# Patient Record
Sex: Male | Born: 1980 | Race: White | Hispanic: No | Marital: Married
Health system: Midwestern US, Academic
[De-identification: ages and names within clinical notes are randomized; demographics above are authoritative.]

---

## 2017-06-25 ENCOUNTER — Ambulatory Visit: Admit: 2017-06-25 | Discharge: 2017-06-25

## 2017-06-25 ENCOUNTER — Encounter: Admit: 2017-06-25 | Discharge: 2017-06-25

## 2017-06-25 DIAGNOSIS — M549 Dorsalgia, unspecified: ICD-10-CM

## 2017-06-25 DIAGNOSIS — R197 Diarrhea, unspecified: Principal | ICD-10-CM

## 2017-06-25 DIAGNOSIS — K319 Disease of stomach and duodenum, unspecified: ICD-10-CM

## 2017-06-25 DIAGNOSIS — M1990 Unspecified osteoarthritis, unspecified site: Principal | ICD-10-CM

## 2017-06-25 DIAGNOSIS — R1013 Epigastric pain: ICD-10-CM

## 2017-06-25 DIAGNOSIS — H547 Unspecified visual loss: ICD-10-CM

## 2017-06-25 MED ORDER — SODIUM CHLORIDE 0.9 % IV SOLP
INTRAVENOUS | 0 refills | Status: CN
Start: 2017-06-25 — End: ?

## 2017-06-25 MED ORDER — HYOSCYAMINE SULFATE 0.125 MG PO TBDI
125 ug | ORAL_TABLET | SUBLINGUAL | 1 refills | Status: AC | PRN
Start: 2017-06-25 — End: 2017-07-28

## 2017-06-25 MED ORDER — LOPERAMIDE 2 MG PO CAP
2 mg | ORAL_CAPSULE | Freq: Four times a day (QID) | ORAL | 5 refills | 10.00000 days | Status: AC | PRN
Start: 2017-06-25 — End: 2017-07-28

## 2017-06-25 MED ORDER — PSYLLIUM HUSK (WITH SUGAR) 3.4 GRAM PO PWPK
1 | PACK | Freq: Two times a day (BID) | ORAL | 5 refills | Status: AC
Start: 2017-06-25 — End: 2017-07-28

## 2017-07-02 ENCOUNTER — Encounter: Admit: 2017-07-02 | Discharge: 2017-07-02

## 2017-07-08 ENCOUNTER — Encounter: Admit: 2017-07-08 | Discharge: 2017-07-08

## 2017-07-08 ENCOUNTER — Ambulatory Visit: Admit: 2017-07-08 | Discharge: 2017-07-08

## 2017-07-08 DIAGNOSIS — F17210 Nicotine dependence, cigarettes, uncomplicated: ICD-10-CM

## 2017-07-08 DIAGNOSIS — K861 Other chronic pancreatitis: ICD-10-CM

## 2017-07-08 DIAGNOSIS — R1013 Epigastric pain: Principal | ICD-10-CM

## 2017-07-08 DIAGNOSIS — M1990 Unspecified osteoarthritis, unspecified site: Principal | ICD-10-CM

## 2017-07-08 DIAGNOSIS — K319 Disease of stomach and duodenum, unspecified: ICD-10-CM

## 2017-07-08 DIAGNOSIS — R634 Abnormal weight loss: ICD-10-CM

## 2017-07-08 DIAGNOSIS — H547 Unspecified visual loss: ICD-10-CM

## 2017-07-08 DIAGNOSIS — M549 Dorsalgia, unspecified: ICD-10-CM

## 2017-07-08 LAB — THYROID STIMULATING HORMONE-TSH: Lab: 0.7 uU/mL (ref 0.35–5.00)

## 2017-07-08 MED ORDER — LIDOCAINE (PF) 200 MG/10 ML (2 %) IJ SYRG
0 refills | Status: DC
Start: 2017-07-08 — End: 2017-07-08
  Administered 2017-07-08: 14:00:00 60 mg via INTRAVENOUS

## 2017-07-08 MED ORDER — FENTANYL CITRATE (PF) 50 MCG/ML IJ SOLN
0 refills | Status: DC
Start: 2017-07-08 — End: 2017-07-08
  Administered 2017-07-08: 14:00:00 50 ug via INTRAVENOUS

## 2017-07-08 MED ORDER — LACTATED RINGERS IV SOLP
1000 mL | INTRAVENOUS | 0 refills | Status: DC
Start: 2017-07-08 — End: 2017-07-08
  Administered 2017-07-08: 13:00:00 1000 mL via INTRAVENOUS

## 2017-07-08 MED ORDER — LIPASE-PROTEASE-AMYLASE 20,880-78,300- 78,300 UNIT PO TAB
3 | ORAL_TABLET | Freq: Three times a day (TID) | ORAL | 5 refills | 30.00000 days | Status: AC
Start: 2017-07-08 — End: 2017-07-25

## 2017-07-08 MED ORDER — FENTANYL CITRATE (PF) 50 MCG/ML IJ SOLN
50 ug | INTRAVENOUS | 0 refills | Status: CN | PRN
Start: 2017-07-08 — End: ?

## 2017-07-08 MED ORDER — PROPOFOL 10 MG/ML IV EMUL 50 ML (INFUSION)(AM)(OR)
INTRAVENOUS | 0 refills | Status: DC
Start: 2017-07-08 — End: 2017-07-08
  Administered 2017-07-08: 14:00:00 140 ug/kg/min via INTRAVENOUS

## 2017-07-08 MED ORDER — ONDANSETRON HCL (PF) 4 MG/2 ML IJ SOLN
4 mg | Freq: Once | INTRAVENOUS | 0 refills | Status: CN | PRN
Start: 2017-07-08 — End: ?

## 2017-07-09 ENCOUNTER — Encounter: Admit: 2017-07-09 | Discharge: 2017-07-09

## 2017-07-09 MED ORDER — LIPASE-PROTEASE-AMYLASE (CREON 24,000) 24,000-76,000- 120,000 UNIT PO CPDR
3 | ORAL_CAPSULE | Freq: Three times a day (TID) | ORAL | 5 refills | 30.00000 days | Status: AC
Start: 2017-07-09 — End: 2017-07-28

## 2017-07-10 ENCOUNTER — Encounter: Admit: 2017-07-10 | Discharge: 2017-07-10

## 2017-07-10 DIAGNOSIS — M1990 Unspecified osteoarthritis, unspecified site: Principal | ICD-10-CM

## 2017-07-10 DIAGNOSIS — M549 Dorsalgia, unspecified: ICD-10-CM

## 2017-07-10 DIAGNOSIS — K319 Disease of stomach and duodenum, unspecified: ICD-10-CM

## 2017-07-10 DIAGNOSIS — H547 Unspecified visual loss: ICD-10-CM

## 2017-07-16 ENCOUNTER — Encounter: Admit: 2017-07-16 | Discharge: 2017-07-16

## 2017-07-16 DIAGNOSIS — R197 Diarrhea, unspecified: Principal | ICD-10-CM

## 2017-07-17 ENCOUNTER — Encounter: Admit: 2017-07-17 | Discharge: 2017-07-17

## 2017-07-17 DIAGNOSIS — M1990 Unspecified osteoarthritis, unspecified site: Principal | ICD-10-CM

## 2017-07-17 DIAGNOSIS — M549 Dorsalgia, unspecified: ICD-10-CM

## 2017-07-17 DIAGNOSIS — K319 Disease of stomach and duodenum, unspecified: ICD-10-CM

## 2017-07-17 DIAGNOSIS — H547 Unspecified visual loss: ICD-10-CM

## 2017-07-21 ENCOUNTER — Encounter: Admit: 2017-07-21 | Discharge: 2017-07-21

## 2017-07-22 ENCOUNTER — Encounter: Admit: 2017-07-22 | Discharge: 2017-07-22

## 2017-07-24 ENCOUNTER — Encounter: Admit: 2017-07-24 | Discharge: 2017-07-24

## 2017-07-28 ENCOUNTER — Encounter: Admit: 2017-07-28 | Discharge: 2017-07-28

## 2017-07-28 MED ORDER — LOPERAMIDE 2 MG PO CAP
2 mg | ORAL_CAPSULE | Freq: Four times a day (QID) | ORAL | 5 refills | 23.00000 days | Status: AC | PRN
Start: 2017-07-28 — End: 2018-07-27

## 2017-07-28 MED ORDER — PSYLLIUM HUSK (WITH SUGAR) 3.4 GRAM PO PWPK
1 | PACK | Freq: Two times a day (BID) | ORAL | 5 refills | Status: AC
Start: 2017-07-28 — End: 2017-07-28

## 2017-07-28 MED ORDER — PSYLLIUM HUSK (WITH SUGAR) 3.4 GRAM PO PWPK
1 | PACK | Freq: Two times a day (BID) | ORAL | 5 refills | Status: AC
Start: 2017-07-28 — End: 2018-07-27

## 2017-07-28 MED ORDER — LIPASE-PROTEASE-AMYLASE (CREON 24,000) 24,000-76,000- 120,000 UNIT PO CPDR
3 | ORAL_CAPSULE | Freq: Three times a day (TID) | ORAL | 5 refills | 30.00000 days | Status: AC
Start: 2017-07-28 — End: 2017-10-24

## 2017-07-28 MED ORDER — HYOSCYAMINE SULFATE 0.125 MG PO TBDI
125 ug | ORAL_TABLET | SUBLINGUAL | 1 refills | Status: AC | PRN
Start: 2017-07-28 — End: 2017-07-28

## 2017-07-28 MED ORDER — LIPASE-PROTEASE-AMYLASE (CREON 24,000) 24,000-76,000- 120,000 UNIT PO CPDR
3 | ORAL_CAPSULE | Freq: Three times a day (TID) | ORAL | 5 refills | 30.00000 days | Status: AC
Start: 2017-07-28 — End: 2017-07-28

## 2017-07-28 MED ORDER — LOPERAMIDE 2 MG PO CAP
2 mg | ORAL_CAPSULE | Freq: Four times a day (QID) | ORAL | 5 refills | 23.00000 days | Status: AC | PRN
Start: 2017-07-28 — End: 2017-07-28

## 2017-07-28 MED ORDER — HYOSCYAMINE SULFATE 0.125 MG PO TBDI
125 ug | ORAL_TABLET | SUBLINGUAL | 1 refills | Status: AC | PRN
Start: 2017-07-28 — End: 2019-02-23

## 2017-07-30 ENCOUNTER — Encounter: Admit: 2017-07-30 | Discharge: 2017-07-30

## 2017-08-08 ENCOUNTER — Encounter: Admit: 2017-08-08 | Discharge: 2017-08-08

## 2017-08-08 DIAGNOSIS — M549 Dorsalgia, unspecified: ICD-10-CM

## 2017-08-08 DIAGNOSIS — R002 Palpitations: Principal | ICD-10-CM

## 2017-08-08 DIAGNOSIS — K319 Disease of stomach and duodenum, unspecified: ICD-10-CM

## 2017-08-08 DIAGNOSIS — K861 Other chronic pancreatitis: ICD-10-CM

## 2017-08-08 DIAGNOSIS — M1990 Unspecified osteoarthritis, unspecified site: Principal | ICD-10-CM

## 2017-08-08 DIAGNOSIS — H547 Unspecified visual loss: ICD-10-CM

## 2017-08-12 ENCOUNTER — Encounter: Admit: 2017-08-12 | Discharge: 2017-08-12

## 2017-08-12 ENCOUNTER — Ambulatory Visit: Admit: 2017-08-12 | Discharge: 2017-08-13 | Payer: Medicaid Other

## 2017-08-12 DIAGNOSIS — R002 Palpitations: Principal | ICD-10-CM

## 2017-08-12 DIAGNOSIS — H547 Unspecified visual loss: ICD-10-CM

## 2017-08-12 DIAGNOSIS — K319 Disease of stomach and duodenum, unspecified: ICD-10-CM

## 2017-08-12 DIAGNOSIS — M1990 Unspecified osteoarthritis, unspecified site: Principal | ICD-10-CM

## 2017-08-12 DIAGNOSIS — M549 Dorsalgia, unspecified: ICD-10-CM

## 2017-08-12 DIAGNOSIS — K861 Other chronic pancreatitis: ICD-10-CM

## 2017-08-25 ENCOUNTER — Ambulatory Visit: Admit: 2017-08-25 | Discharge: 2017-08-26 | Payer: Medicaid Other

## 2017-08-25 ENCOUNTER — Encounter: Admit: 2017-08-25 | Discharge: 2017-08-25

## 2017-08-25 DIAGNOSIS — K861 Other chronic pancreatitis: ICD-10-CM

## 2017-08-25 DIAGNOSIS — R002 Palpitations: Principal | ICD-10-CM

## 2017-08-25 MED ORDER — PERFLUTREN LIPID MICROSPHERES 1.1 MG/ML IV SUSP
1-20 mL | Freq: Once | INTRAVENOUS | 0 refills | Status: CP | PRN
Start: 2017-08-25 — End: ?

## 2017-08-28 ENCOUNTER — Encounter: Admit: 2017-08-28 | Discharge: 2017-08-28

## 2017-09-03 ENCOUNTER — Encounter: Admit: 2017-09-03 | Discharge: 2017-09-03

## 2017-09-03 DIAGNOSIS — R197 Diarrhea, unspecified: Principal | ICD-10-CM

## 2017-09-09 ENCOUNTER — Encounter: Admit: 2017-09-09 | Discharge: 2017-09-09

## 2017-09-18 ENCOUNTER — Encounter: Admit: 2017-09-18 | Discharge: 2017-09-18

## 2017-10-24 ENCOUNTER — Encounter: Admit: 2017-10-24 | Discharge: 2017-10-24

## 2017-10-24 ENCOUNTER — Ambulatory Visit: Admit: 2017-10-24 | Discharge: 2017-10-25 | Payer: Medicaid Other

## 2017-10-24 DIAGNOSIS — K319 Disease of stomach and duodenum, unspecified: ICD-10-CM

## 2017-10-24 DIAGNOSIS — R197 Diarrhea, unspecified: Secondary | ICD-10-CM

## 2017-10-24 DIAGNOSIS — H547 Unspecified visual loss: ICD-10-CM

## 2017-10-24 DIAGNOSIS — M549 Dorsalgia, unspecified: ICD-10-CM

## 2017-10-24 DIAGNOSIS — M1990 Unspecified osteoarthritis, unspecified site: Principal | ICD-10-CM

## 2017-10-24 MED ORDER — PREGABALIN 75 MG PO CAP
75 mg | ORAL_CAPSULE | Freq: Two times a day (BID) | ORAL | 3 refills | Status: AC
Start: 2017-10-24 — End: 2017-11-06

## 2017-10-24 MED ORDER — ONDANSETRON HCL 8 MG PO TAB
8 mg | ORAL_TABLET | ORAL | 2 refills | 8.00000 days | Status: AC | PRN
Start: 2017-10-24 — End: 2018-04-01

## 2017-10-24 MED ORDER — LIPASE-PROTEASE-AMYLASE(+)(VIOKACE) 20,880-78,300- 78,300 UNIT PO TAB
1 | ORAL_TABLET | Freq: Three times a day (TID) | ORAL | 3 refills | 30.00000 days | Status: AC
Start: 2017-10-24 — End: 2017-11-06

## 2017-10-24 MED ORDER — OMEPRAZOLE 20 MG PO CPDR
20 mg | ORAL_CAPSULE | Freq: Every day | ORAL | 3 refills | Status: AC
Start: 2017-10-24 — End: 2018-04-01

## 2017-10-25 DIAGNOSIS — D125 Benign neoplasm of sigmoid colon: ICD-10-CM

## 2017-10-25 DIAGNOSIS — K861 Other chronic pancreatitis: Principal | ICD-10-CM

## 2017-10-25 DIAGNOSIS — K219 Gastro-esophageal reflux disease without esophagitis: ICD-10-CM

## 2017-10-25 DIAGNOSIS — R1010 Upper abdominal pain, unspecified: ICD-10-CM

## 2017-10-26 ENCOUNTER — Encounter: Admit: 2017-10-26 | Discharge: 2017-10-26

## 2017-10-26 DIAGNOSIS — M1990 Unspecified osteoarthritis, unspecified site: Principal | ICD-10-CM

## 2017-10-26 DIAGNOSIS — M549 Dorsalgia, unspecified: ICD-10-CM

## 2017-10-26 DIAGNOSIS — K319 Disease of stomach and duodenum, unspecified: ICD-10-CM

## 2017-10-26 DIAGNOSIS — H547 Unspecified visual loss: ICD-10-CM

## 2017-10-26 DIAGNOSIS — K861 Other chronic pancreatitis: ICD-10-CM

## 2017-10-28 ENCOUNTER — Encounter: Admit: 2017-10-28 | Discharge: 2017-10-28

## 2017-10-29 ENCOUNTER — Encounter: Admit: 2017-10-29 | Discharge: 2017-10-29

## 2017-11-06 MED ORDER — NORTRIPTYLINE 25 MG PO CAP
25 mg | ORAL_CAPSULE | Freq: Every evening | ORAL | 2 refills | Status: AC
Start: 2017-11-06 — End: 2018-07-27

## 2017-11-06 MED ORDER — LIPASE-PROTEASE-AMYLASE 25,000-79,000- 105,000 UNIT PO CPDR
3 | ORAL_CAPSULE | Freq: Three times a day (TID) | ORAL | 1 refills | 30.00000 days | Status: AC
Start: 2017-11-06 — End: 2018-01-20

## 2017-12-03 ENCOUNTER — Encounter: Admit: 2017-12-03 | Discharge: 2017-12-03

## 2017-12-31 ENCOUNTER — Encounter: Admit: 2017-12-31 | Discharge: 2017-12-31

## 2017-12-31 ENCOUNTER — Ambulatory Visit: Admit: 2017-12-31 | Discharge: 2018-01-01 | Payer: Medicaid Other

## 2017-12-31 DIAGNOSIS — M549 Dorsalgia, unspecified: ICD-10-CM

## 2017-12-31 DIAGNOSIS — K219 Gastro-esophageal reflux disease without esophagitis: ICD-10-CM

## 2017-12-31 DIAGNOSIS — H547 Unspecified visual loss: ICD-10-CM

## 2017-12-31 DIAGNOSIS — R197 Diarrhea, unspecified: ICD-10-CM

## 2017-12-31 DIAGNOSIS — R1010 Upper abdominal pain, unspecified: Principal | ICD-10-CM

## 2017-12-31 DIAGNOSIS — K861 Other chronic pancreatitis: ICD-10-CM

## 2017-12-31 DIAGNOSIS — K319 Disease of stomach and duodenum, unspecified: ICD-10-CM

## 2017-12-31 DIAGNOSIS — M1990 Unspecified osteoarthritis, unspecified site: Principal | ICD-10-CM

## 2017-12-31 DIAGNOSIS — D125 Benign neoplasm of sigmoid colon: ICD-10-CM

## 2018-01-06 ENCOUNTER — Encounter: Admit: 2018-01-06 | Discharge: 2018-01-06

## 2018-01-06 DIAGNOSIS — F419 Anxiety disorder, unspecified: Principal | ICD-10-CM

## 2018-01-16 ENCOUNTER — Encounter: Admit: 2018-01-16 | Discharge: 2018-01-16

## 2018-01-19 ENCOUNTER — Encounter: Admit: 2018-01-19 | Discharge: 2018-01-19

## 2018-01-20 MED ORDER — LIPASE-PROTEASE-AMYLASE 25,000-79,000- 105,000 UNIT PO CPDR
3 | ORAL_CAPSULE | Freq: Three times a day (TID) | ORAL | 5 refills | 30.00000 days | Status: AC
Start: 2018-01-20 — End: 2018-04-01

## 2018-01-20 MED ORDER — ZENPEP 25,000-79,000- 105,000 UNIT PO CPDR
ORAL_CAPSULE | Freq: Three times a day (TID) | 1 refills
Start: 2018-01-20 — End: ?

## 2018-03-30 ENCOUNTER — Encounter: Admit: 2018-03-30 | Discharge: 2018-03-30

## 2018-03-30 DIAGNOSIS — K861 Other chronic pancreatitis: Principal | ICD-10-CM

## 2018-04-01 MED ORDER — ONDANSETRON HCL 8 MG PO TAB
8 mg | ORAL_TABLET | ORAL | 2 refills | 8.00000 days | Status: AC | PRN
Start: 2018-04-01 — End: 2018-07-13

## 2018-04-01 MED ORDER — LIPASE-PROTEASE-AMYLASE (VIOKACE) 20,880-78,300- 78,300 UNIT PO TAB
1 | ORAL_TABLET | Freq: Three times a day (TID) | ORAL | 3 refills | 30.00000 days | Status: AC
Start: 2018-04-01 — End: 2018-07-27

## 2018-04-01 MED ORDER — OMEPRAZOLE 20 MG PO CPDR
20 mg | ORAL_CAPSULE | Freq: Every day | ORAL | 3 refills | Status: AC
Start: 2018-04-01 — End: 2019-02-23

## 2018-04-02 ENCOUNTER — Encounter: Admit: 2018-04-02 | Discharge: 2018-04-02

## 2018-04-03 ENCOUNTER — Encounter: Admit: 2018-04-03 | Discharge: 2018-04-03

## 2018-04-03 ENCOUNTER — Encounter: Admit: 2018-04-03 | Discharge: 2018-04-04

## 2018-04-03 DIAGNOSIS — R69 Illness, unspecified: Principal | ICD-10-CM

## 2018-04-14 ENCOUNTER — Encounter: Admit: 2018-04-14 | Discharge: 2018-04-15

## 2018-05-25 ENCOUNTER — Encounter: Admit: 2018-05-25 | Discharge: 2018-05-25

## 2018-05-25 DIAGNOSIS — R69 Illness, unspecified: Principal | ICD-10-CM

## 2018-06-01 ENCOUNTER — Encounter: Admit: 2018-06-01 | Discharge: 2018-06-01

## 2018-07-13 ENCOUNTER — Encounter: Admit: 2018-07-13 | Discharge: 2018-07-13

## 2018-07-13 MED ORDER — ONDANSETRON 8 MG PO TBDI
ORAL_TABLET | ORAL | 1 refills | 8.00000 days | Status: AC | PRN
Start: 2018-07-13 — End: 2018-09-14

## 2018-07-16 ENCOUNTER — Encounter: Admit: 2018-07-16 | Discharge: 2018-07-16

## 2018-07-22 ENCOUNTER — Encounter: Admit: 2018-07-22 | Discharge: 2018-07-22

## 2018-07-22 NOTE — Patient Instructions
It was a pleasure seeing you in clinic today.    Jen Mow RN, CNC/Cassandra Seib RN   Dr. Ivin Booty T. Bunch/Ortho Spine Surgery  [facility]  [facility]  [address]  [place], [place] [address]  Phone: [phone]   Fax [phone]  Scheduling [phone]  [email or web address]      For up to date information on the COVID-19 virus, visit the [facility] website. [email or web address]  ? General supportive care during cold and flu season and infection prevention reminders:   o Wash hands often with soap and water for at least 20 seconds  o Cover your mouth and nose  o Social distancing: try to maintain 6 feet between you and other people  o Stay home if sick and symptoms mild or manageable  ? If you must be around people wear a mask    ? If you are having symptoms of a lower respiratory infection (cough, shortness of breath) and/or fever AND either traveled in last 30 days (internationally or to region of exposure) OR known exposure to patient with COVID19:    o Call your primary care provider for questions or health needs.   ? Tell your doctor about your recent travel and your symptoms    o In a medical emergency, call 911 or go to the nearest emergency room.

## 2018-07-23 ENCOUNTER — Encounter: Admit: 2018-07-23 | Discharge: 2018-07-23

## 2018-07-24 ENCOUNTER — Encounter: Admit: 2018-07-24 | Discharge: 2018-07-24

## 2018-07-24 DIAGNOSIS — M549 Dorsalgia, unspecified: ICD-10-CM

## 2018-07-24 DIAGNOSIS — M5033 Other cervical disc degeneration, cervicothoracic region: Secondary | ICD-10-CM

## 2018-07-24 DIAGNOSIS — M1990 Unspecified osteoarthritis, unspecified site: Principal | ICD-10-CM

## 2018-07-24 DIAGNOSIS — H547 Unspecified visual loss: ICD-10-CM

## 2018-07-24 DIAGNOSIS — K319 Disease of stomach and duodenum, unspecified: ICD-10-CM

## 2018-07-24 DIAGNOSIS — K861 Other chronic pancreatitis: ICD-10-CM

## 2018-07-24 NOTE — Progress Notes
think that doing an ACDF at [facility] is going to cure his pain for sure.  We will follow up with the patient after the MRI is completed.          In the presence of Aaron Rosales, MD , I have taken down these notes, Aaron Olson, Scribe. July 24, 2018 1:24 PM

## 2018-07-25 ENCOUNTER — Ambulatory Visit: Admit: 2018-07-24 | Discharge: 2018-07-25 | Payer: Medicaid Other

## 2018-07-25 DIAGNOSIS — M4802 Spinal stenosis, cervical region: ICD-10-CM

## 2018-07-25 DIAGNOSIS — M5416 Radiculopathy, lumbar region: Principal | ICD-10-CM

## 2018-07-25 DIAGNOSIS — M5030 Other cervical disc degeneration, unspecified cervical region: ICD-10-CM

## 2018-07-25 DIAGNOSIS — G9589 Other specified diseases of spinal cord: ICD-10-CM

## 2018-07-25 DIAGNOSIS — M4626 Osteomyelitis of vertebra, lumbar region: Secondary | ICD-10-CM

## 2018-07-27 ENCOUNTER — Encounter: Admit: 2018-07-27 | Discharge: 2018-07-27

## 2018-07-27 ENCOUNTER — Ambulatory Visit: Admit: 2018-07-27 | Discharge: 2018-07-28 | Payer: Medicaid Other

## 2018-07-27 DIAGNOSIS — K861 Other chronic pancreatitis: ICD-10-CM

## 2018-07-27 DIAGNOSIS — M549 Dorsalgia, unspecified: ICD-10-CM

## 2018-07-27 DIAGNOSIS — M1990 Unspecified osteoarthritis, unspecified site: Principal | ICD-10-CM

## 2018-07-27 DIAGNOSIS — Z86010 Personal history of colonic polyps: ICD-10-CM

## 2018-07-27 DIAGNOSIS — K319 Disease of stomach and duodenum, unspecified: ICD-10-CM

## 2018-07-27 DIAGNOSIS — K5900 Constipation, unspecified: Principal | ICD-10-CM

## 2018-07-27 DIAGNOSIS — R198 Other specified symptoms and signs involving the digestive system and abdomen: ICD-10-CM

## 2018-07-27 DIAGNOSIS — H547 Unspecified visual loss: ICD-10-CM

## 2018-07-27 MED ORDER — RIFAXIMIN 550 MG PO TAB
550 mg | ORAL_TABLET | Freq: Three times a day (TID) | ORAL | 0 refills | 30.00000 days | Status: AC
Start: 2018-07-27 — End: ?

## 2018-07-27 MED ORDER — LIPASE-PROTEASE-AMYLASE (VIOKACE) 20,880-78,300- 78,300 UNIT PO TAB
1 | ORAL_TABLET | Freq: Three times a day (TID) | ORAL | 3 refills | 30.00000 days | Status: DC
Start: 2018-07-27 — End: 2019-03-19

## 2018-07-27 MED ORDER — PREGABALIN 75 MG PO CAP
75 mg | ORAL_CAPSULE | Freq: Two times a day (BID) | ORAL | 5 refills | Status: DC
Start: 2018-07-27 — End: 2019-02-24

## 2018-07-27 MED ORDER — SODIUM CHLORIDE 0.9 % IV SOLP
INTRAVENOUS | 0 refills | Status: CN
Start: 2018-07-27 — End: ?

## 2018-07-28 ENCOUNTER — Encounter: Admit: 2018-07-28 | Discharge: 2018-07-28

## 2018-07-28 DIAGNOSIS — K861 Other chronic pancreatitis: Principal | ICD-10-CM

## 2018-07-28 NOTE — Telephone Encounter
Incoming fax pt needs a PA for Xifaxan.

## 2018-07-29 ENCOUNTER — Ambulatory Visit: Admit: 2018-07-29 | Discharge: 2018-07-29 | Payer: Medicaid Other

## 2018-07-29 DIAGNOSIS — R198 Other specified symptoms and signs involving the digestive system and abdomen: ICD-10-CM

## 2018-08-11 ENCOUNTER — Encounter: Admit: 2018-08-11 | Discharge: 2018-08-11

## 2018-08-12 ENCOUNTER — Encounter: Admit: 2018-08-12 | Discharge: 2018-08-12

## 2018-08-12 MED ORDER — ZENPEP 25,000-79,000- 105,000 UNIT PO CPDR
ORAL_CAPSULE | Freq: Three times a day (TID) | ORAL | 4 refills | 30.00000 days | Status: DC
Start: 2018-08-12 — End: 2019-02-24

## 2018-08-12 NOTE — Telephone Encounter
Refill request received for ZenPep  Last OV 07/27/2018  Last fill  01/30/2018 390 w/5 refills    Routing to [employer], APRN u for approval/refusal      PA in process for Viokase

## 2018-08-24 ENCOUNTER — Encounter: Admit: 2018-08-24 | Discharge: 2018-08-24

## 2018-09-14 ENCOUNTER — Encounter: Admit: 2018-09-14 | Discharge: 2018-09-14

## 2018-09-14 MED ORDER — ONDANSETRON 8 MG PO TBDI
ORAL_TABLET | ORAL | 1 refills | 8.00000 days | Status: DC | PRN
Start: 2018-09-14 — End: 2018-11-20

## 2018-09-14 NOTE — Telephone Encounter
Refill request received for zofran  Last OV 07/27/2018  Last fill 07/13/2018 60tabs w/1 refill    Routing to Glenice Bow, APRN for approval/refusal

## 2018-10-11 IMAGING — CR PELVIS
5 series · 5 of 5 positions shown · non-contrast
Comparison: none

[l-spine ap]
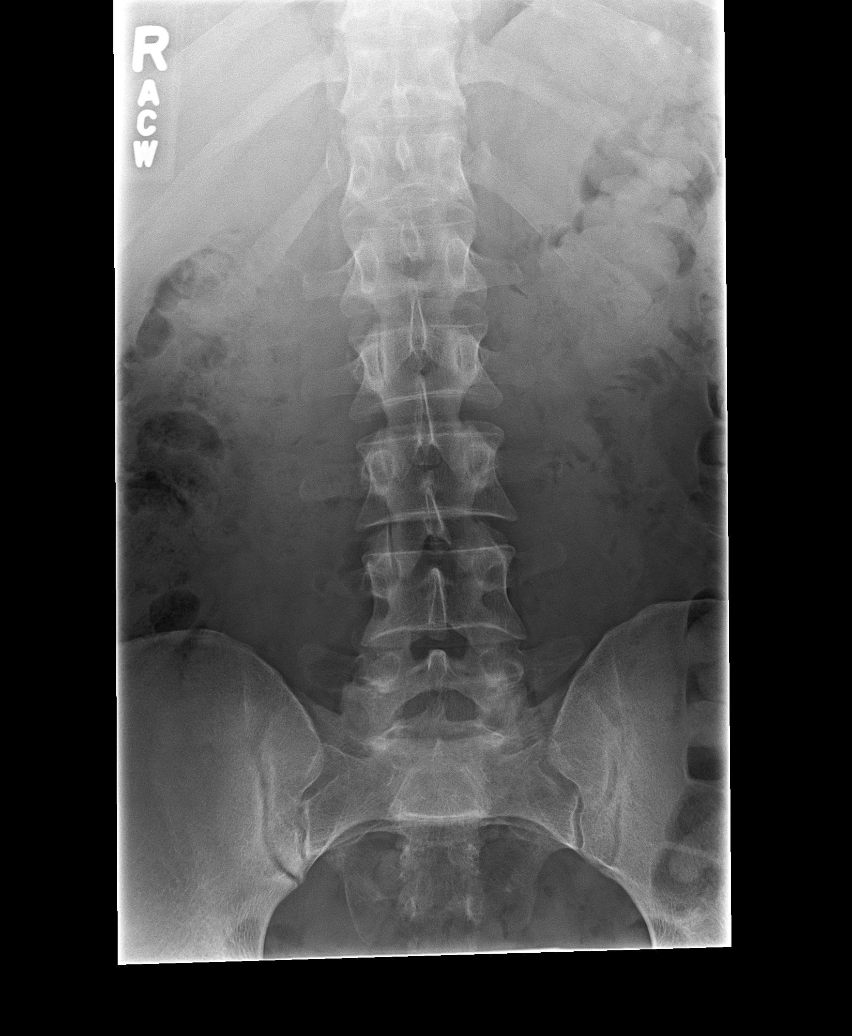

[l-spine obl (1 of 2)]
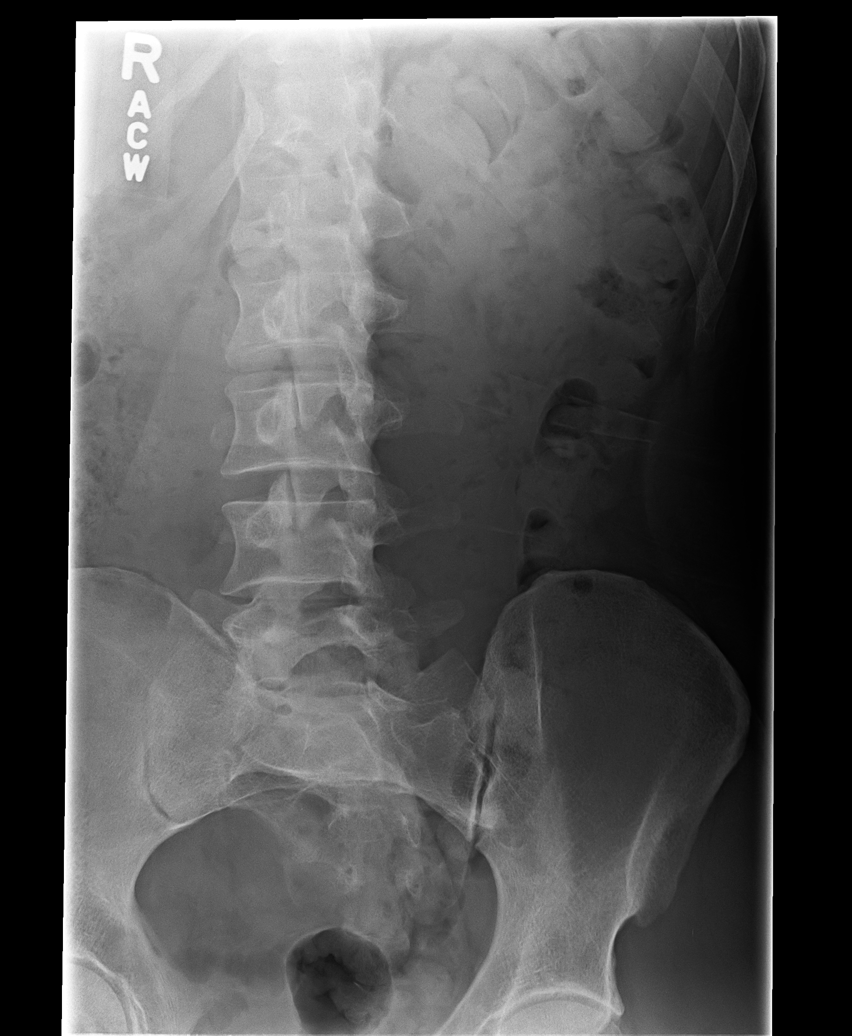

[l-spine obl (2 of 2)]
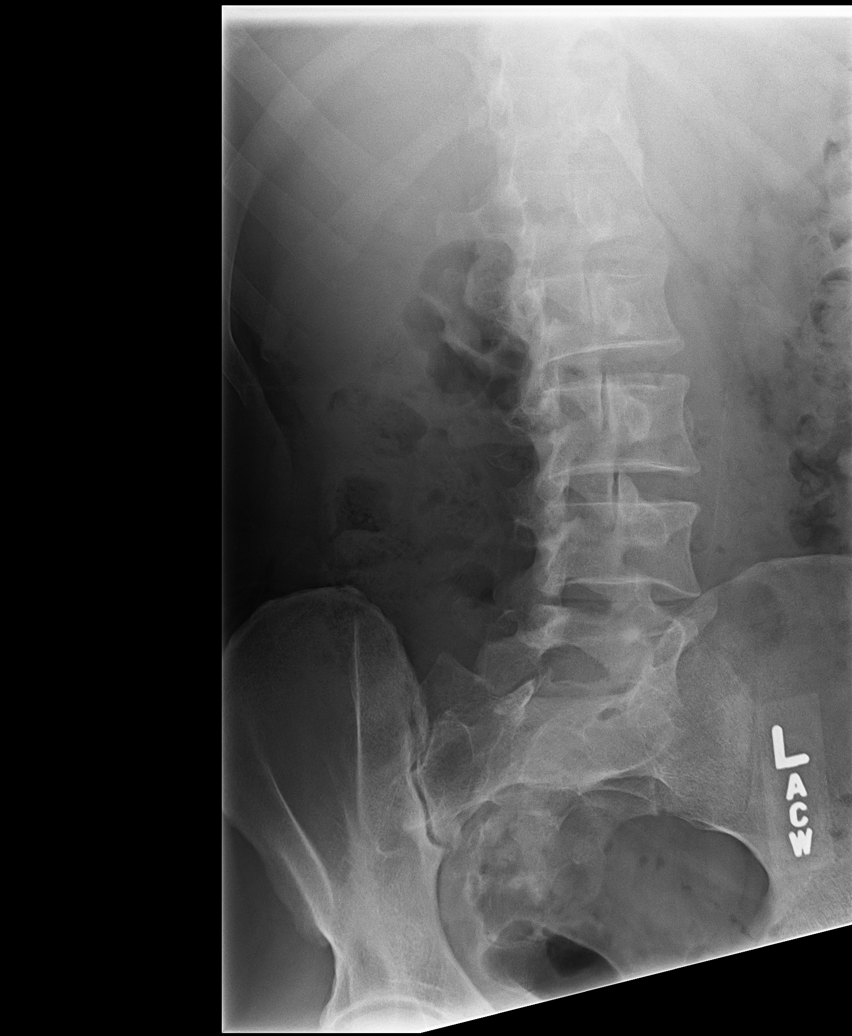

[l-spine lat]
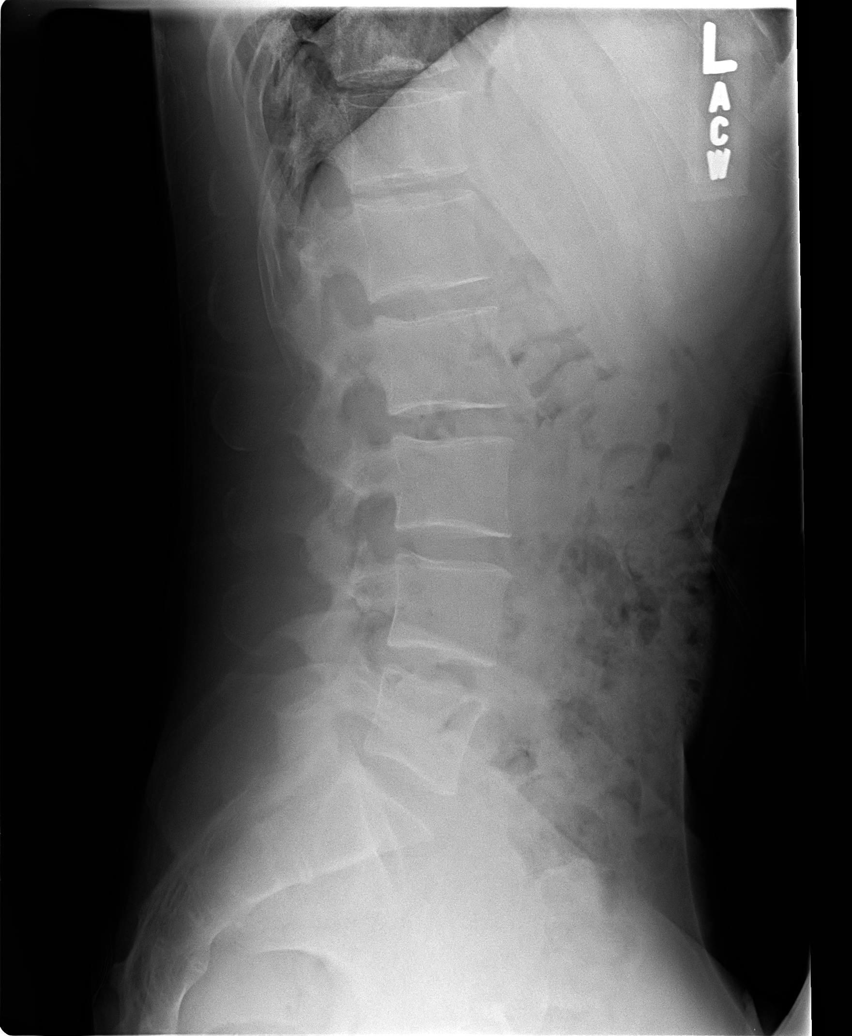

[l-spine l5-s1]
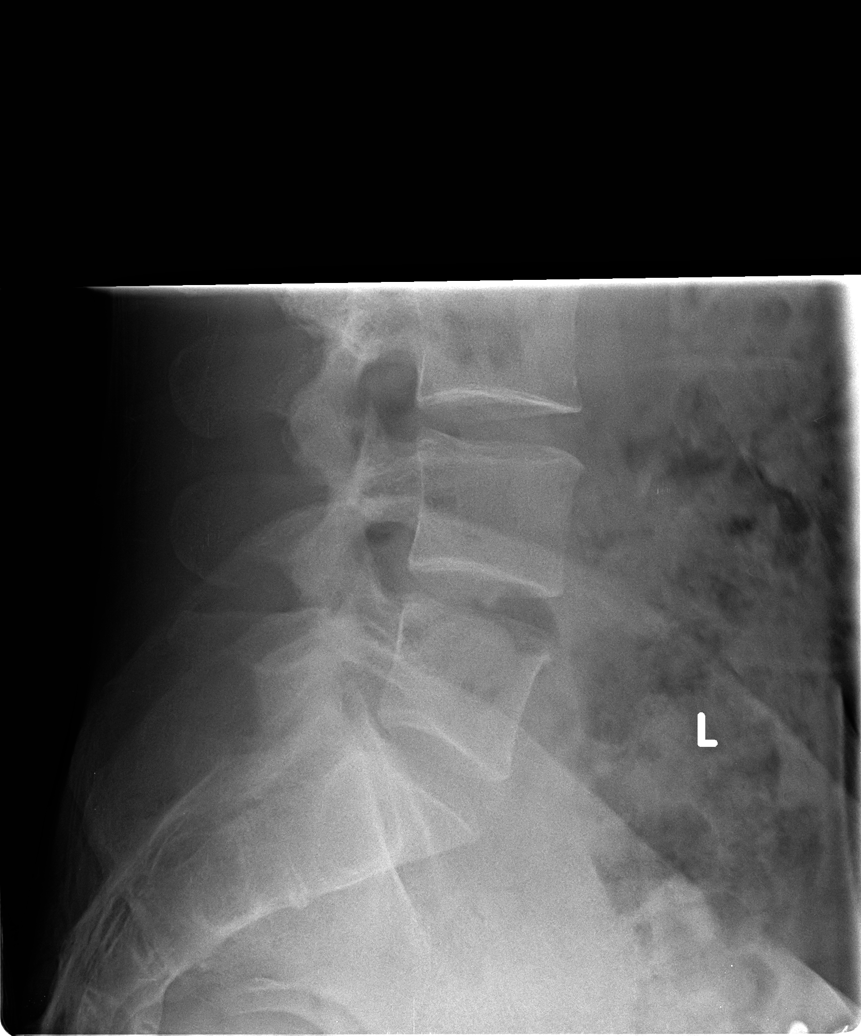

[5 of 5 positions shown; findings below may reference images not displayed]

EXAM
RADIOLOGIC EXAMINATION, SPINE, LUMBOSACRAL; MINIMUM 4 VIEWS, CPT 47884

INDICATION
chronic mid to low back pain.
Cronic mid to lower back pain. NKI AW

TECHNIQUE
Five views of the lumbar spine were obtained including obliques.

COMPARISONS
None

FINDINGS
There is no evidence of fracture. No alignment abnormality is identified. No soft tissue
abnormality is identified.

IMPRESSION
Normal lumbar spine series.

Tech Notes:

Cronic mid to lower back pain. NKI AW

## 2018-10-26 ENCOUNTER — Encounter: Admit: 2018-10-26 | Discharge: 2018-10-26

## 2018-10-26 NOTE — Telephone Encounter
Received message from scheduling team to make sure pt has prep instructions for upcoming procedure and if pt has COVID19 test scheduled. Pt does not need COVID19 test due to pt will not be under anastasia. Prep instructions were sent.

## 2018-11-20 ENCOUNTER — Encounter: Admit: 2018-11-20 | Discharge: 2018-11-20

## 2018-11-20 MED ORDER — ONDANSETRON 8 MG PO TBDI
ORAL_TABLET | ORAL | 1 refills | 8.00000 days | Status: AC | PRN
Start: 2018-11-20 — End: ?

## 2018-11-20 NOTE — Telephone Encounter
Refill request received for ondansetron  Last OV 07/27/2018  Last fill 09/14/2018 60 tabs w/1 refill    Routing to Glenice Bow, APRN for approval/refusal

## 2018-11-30 ENCOUNTER — Encounter: Admit: 2018-11-30 | Discharge: 2018-11-30

## 2018-11-30 DIAGNOSIS — K861 Other chronic pancreatitis: Secondary | ICD-10-CM

## 2018-11-30 DIAGNOSIS — H547 Unspecified visual loss: Secondary | ICD-10-CM

## 2018-11-30 DIAGNOSIS — M549 Dorsalgia, unspecified: Secondary | ICD-10-CM

## 2018-11-30 DIAGNOSIS — M1990 Unspecified osteoarthritis, unspecified site: Secondary | ICD-10-CM

## 2018-11-30 DIAGNOSIS — K319 Disease of stomach and duodenum, unspecified: Secondary | ICD-10-CM

## 2019-01-19 ENCOUNTER — Encounter: Admit: 2019-01-19 | Discharge: 2019-01-19

## 2019-01-19 NOTE — Telephone Encounter
-   Schedule ARM as previously recommended.   - KUB, radiology to comment on stool burden.   Dx abdominal pain.    - did he take Xifaxan previously as recommended? If so, what symptoms did it help.    - see me in clinic as planned.   - go to ER as needed in the meantime

## 2019-01-19 NOTE — Telephone Encounter
Pt reports that Lyrica has not worked. Pt has been trying it for a while without success. Pt is having anal pain and abdominal pain as well. Describes the pain as throbbing.  Feels the bowel movement moving around in colon and feel food digesting. Pt reports 1 small BM daily, if that, Sometimes pebbles and sometimes runny, feels like defecation is incomplete. Feels constipated and nauseous. Denies vomiting, Denies fever, has hot flashes and chills. Pt repots a spot on his belly button that is pulsating and hurts. Sharp pain that radiates down. Rectal area is a burning pain.     Pt is taking Viokase. 1-2 tablets with meals and 1 with snacks. Pt takes Zofran  when he is nauseous but not all the time bc he gets constipation and headaches with it.  Pt reports that the hyoscyamine has not helped with stomach pain. Pt reports that Prilosec is helping with GERD symptoms and gas.    Pt is on schedule to be seen on 10/22 at 8am full hour visit as this seems to be a complex patient. Routing to [place] for recommendations in the mean time.     Pt's spouse will attempt to reschedule his ARM. Scheduling number sent via mychart.

## 2019-02-03 ENCOUNTER — Encounter: Admit: 2019-02-03 | Discharge: 2019-02-03

## 2019-02-03 MED ORDER — TIZANIDINE 4 MG PO TAB
ORAL_TABLET | Freq: Three times a day (TID) | 0 refills
Start: 2019-02-03 — End: ?

## 2019-02-03 NOTE — Telephone Encounter
Refill request received for tizanidine  Last OV 07/27/2018      Spoke with pharmacy, sent in error. Was supposed to go primary physician. Routing to [employer] APRN for review for interactions with our prescribed medications as this was not on his list and refusal of medication.

## 2019-02-23 ENCOUNTER — Ambulatory Visit: Admit: 2019-02-23 | Discharge: 2019-02-24 | Payer: Medicaid Other

## 2019-02-23 ENCOUNTER — Encounter: Admit: 2019-02-23 | Discharge: 2019-02-23

## 2019-02-23 DIAGNOSIS — R109 Unspecified abdominal pain: Secondary | ICD-10-CM

## 2019-02-23 DIAGNOSIS — K319 Disease of stomach and duodenum, unspecified: Secondary | ICD-10-CM

## 2019-02-23 DIAGNOSIS — M549 Dorsalgia, unspecified: Secondary | ICD-10-CM

## 2019-02-23 DIAGNOSIS — K861 Other chronic pancreatitis: Secondary | ICD-10-CM

## 2019-02-23 DIAGNOSIS — M1990 Unspecified osteoarthritis, unspecified site: Secondary | ICD-10-CM

## 2019-02-23 DIAGNOSIS — H547 Unspecified visual loss: Secondary | ICD-10-CM

## 2019-02-23 MED ORDER — PANTOPRAZOLE 40 MG PO TBEC
40 mg | ORAL_TABLET | Freq: Every day | ORAL | 5 refills | 90.00000 days | Status: AC
Start: 2019-02-23 — End: ?

## 2019-02-23 MED ORDER — TRULANCE 3 MG PO TAB
3 mg | ORAL_TABLET | Freq: Every day | ORAL | 5 refills | Status: AC
Start: 2019-02-23 — End: ?

## 2019-02-23 MED ORDER — DICYCLOMINE 10 MG PO CAP
10 mg | ORAL_CAPSULE | Freq: Three times a day (TID) | ORAL | 5 refills | Status: AC | PRN
Start: 2019-02-23 — End: ?

## 2019-02-25 ENCOUNTER — Encounter: Admit: 2019-02-25 | Discharge: 2019-02-25

## 2019-02-25 NOTE — Telephone Encounter
Incoming fax PA needed for Trulance.     PA was Started through [email or web address]  Key Code: [id number]. Can take 24-72 hours for determination. If you have any question contact the number on the back of insurance card.

## 2019-03-10 ENCOUNTER — Encounter: Admit: 2019-03-10 | Discharge: 2019-03-10

## 2019-03-17 ENCOUNTER — Encounter: Admit: 2019-03-17 | Discharge: 2019-03-17

## 2019-03-17 NOTE — Telephone Encounter
Incoming call from pt SO, [name]. Pt has been rescheduled for a telehealth appt on 12/15 at 0800. She reports that the pt had one day of diarrhea since starting the Trulance but has been constipated since. He is still taking the Trulance daily. They will try to get the abdominal xray completed as soon as they can get into [facility].    Inquiring if ARM testing can be done at [facility] as well. Explained that we prefer those procedures are done at [facility], but will ask Lora NP if it would be ok to do the anorectal manometry elsewhere.

## 2019-03-17 NOTE — Telephone Encounter
I am not aware that [facility] dose ARM type studies. Natale Milch can check but I would prefer it done at [facility].

## 2019-03-19 ENCOUNTER — Encounter: Admit: 2019-03-19 | Discharge: 2019-03-19

## 2019-03-19 DIAGNOSIS — K861 Other chronic pancreatitis: Secondary | ICD-10-CM

## 2019-03-19 MED ORDER — VIOKACE 20,880-78,300- 78,300 UNIT PO TAB
ORAL_TABLET | Freq: Three times a day (TID) | ORAL | 2 refills | 30.00000 days | Status: AC
Start: 2019-03-19 — End: ?

## 2019-03-19 NOTE — Telephone Encounter
Refill request received for viokase  Last OV 02/23/2019  Last fill 07/27/2018 270 tabs w/3 refills    Routing to Glenice Bow, APRN for approval/refusal

## 2019-03-19 NOTE — Telephone Encounter
Reviewed KUB dated 03/18/2019 from [facility].  No evidence of dilated small bowel loops or air-fluid levels.  No pneumoperitoneum.  No high-grade obstruction noted. Diffuse colonic gas.  Moderate distal colonic stool and rectal stool.    Aaron Olson - call pt. Based on KUB findings, recommend the following:    1. Fleets enema X 1-2 days followed by dulcolax suppository daily as needed, use after breakfast and coffee  2. Trulance trial as planned.   3. Call with progress report in 2 weeks.   4. ARM as recommended.

## 2019-05-11 ENCOUNTER — Encounter: Admit: 2019-05-11 | Discharge: 2019-05-11

## 2019-05-11 NOTE — Telephone Encounter
Attempted to call patient to schedule appt per request of Dr. Milas Gain with Dr. Candi Leash. No answer. VM Left

## 2019-05-12 ENCOUNTER — Encounter: Admit: 2019-05-12 | Discharge: 2019-05-12

## 2019-06-07 ENCOUNTER — Encounter: Admit: 2019-06-07 | Discharge: 2019-06-07

## 2019-08-19 ENCOUNTER — Encounter: Admit: 2019-08-19 | Discharge: 2019-08-19

## 2019-08-19 ENCOUNTER — Encounter: Admit: 2018-12-01 | Discharge: 2018-12-01

## 2019-08-19 ENCOUNTER — Ambulatory Visit: Admit: 2019-08-19 | Discharge: 2019-08-19

## 2019-08-19 ENCOUNTER — Ambulatory Visit: Admit: 2019-08-19 | Discharge: 2019-08-20 | Payer: Medicaid Other

## 2019-08-19 MED ORDER — PEG-ELECTROLYTE SOLN 420 GRAM PO SOLR
0 refills | Status: AC
Start: 2019-08-19 — End: ?

## 2019-08-19 MED ORDER — HYOSCYAMINE SULFATE 0.125 MG PO TBDI
125 ug | ORAL_TABLET | SUBLINGUAL | 1 refills | Status: AC | PRN
Start: 2019-08-19 — End: ?

## 2019-08-20 ENCOUNTER — Encounter: Admit: 2019-08-20 | Discharge: 2019-08-20

## 2019-08-20 NOTE — Telephone Encounter
Pharmacy called. Pt needing a PA on Viokase. 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

## 2019-08-31 ENCOUNTER — Encounter: Admit: 2019-08-31 | Discharge: 2019-08-31

## 2019-08-31 NOTE — Telephone Encounter
Yes I would prefer the procedures be done at [facility] as I would like to ensure the correct biopsies are obtained. At our telemedicine visit he was agreeable to that.

## 2019-08-31 NOTE — Telephone Encounter
Received incoming VM from Kim with [facility] reporting that they received a referral for patient to have an EGD and Colonoscopy with them, but had some questions.   Outbound call  [phone]. Spoke with Selena Batten. She verified that the referral for the procedures came to their office on 08/24/19 from the patient's PCP. The surgeon said he was concerned because the EGD was noted for epigastric pain, but patient last had colonoscopy in 2018, and is not due for another until January 2022. Informed Selena Batten that the patient was recently seen in our clinic, and is scheduled for an EGD and Colonoscopy with [place] on November 17, 2019. Also informed her that the patient nor his wife had contacted our office to let us know he would prefer to have the procedures done elsewhere.   Outbound call to pt. LVM (noauthorization on file to leave a detailed VM). Provided call back number. Asked for return call.     Routing to Dr. Ihor Gully.

## 2019-09-09 ENCOUNTER — Encounter: Admit: 2019-09-09 | Discharge: 2019-09-09

## 2019-09-15 ENCOUNTER — Encounter: Admit: 2019-09-15 | Discharge: 2019-09-15

## 2019-09-17 ENCOUNTER — Encounter: Admit: 2019-09-17 | Discharge: 2019-09-17

## 2019-11-17 ENCOUNTER — Encounter: Admit: 2019-11-17 | Discharge: 2019-11-17

## 2019-11-18 ENCOUNTER — Encounter: Admit: 2019-11-18 | Discharge: 2019-11-18

## 2020-01-13 ENCOUNTER — Encounter: Admit: 2020-01-13 | Discharge: 2020-01-13

## 2020-01-13 ENCOUNTER — Ambulatory Visit: Admit: 2020-01-13 | Discharge: 2020-01-13 | Payer: Medicaid Other

## 2020-01-13 DIAGNOSIS — R109 Unspecified abdominal pain: Secondary | ICD-10-CM

## 2020-01-13 DIAGNOSIS — K5900 Constipation, unspecified: Secondary | ICD-10-CM

## 2020-01-13 DIAGNOSIS — Z20822 Encounter for screening laboratory testing for COVID-19 virus in asymptomatic patient: Secondary | ICD-10-CM

## 2020-01-13 DIAGNOSIS — R634 Abnormal weight loss: Secondary | ICD-10-CM

## 2020-01-28 ENCOUNTER — Encounter: Admit: 2020-06-12 | Discharge: 2020-06-12

## 2020-01-28 ENCOUNTER — Encounter: Admit: 2020-01-28 | Discharge: 2020-01-28

## 2020-03-02 ENCOUNTER — Encounter: Admit: 2020-03-02 | Discharge: 2020-03-02

## 2020-03-29 ENCOUNTER — Encounter: Admit: 2020-03-29 | Discharge: 2020-03-29

## 2020-03-30 NOTE — Telephone Encounter
NEW  Referral from Charlton Haws, MD  [facility]     For Sleep apnea

## 2020-04-03 ENCOUNTER — Encounter: Admit: 2020-04-03 | Discharge: 2020-04-03

## 2020-05-18 ENCOUNTER — Encounter: Admit: 2020-05-18 | Discharge: 2020-05-18

## 2020-05-19 ENCOUNTER — Encounter: Admit: 2020-05-19 | Discharge: 2020-05-19

## 2020-05-19 DIAGNOSIS — M549 Dorsalgia, unspecified: Secondary | ICD-10-CM

## 2020-05-19 DIAGNOSIS — M1990 Unspecified osteoarthritis, unspecified site: Secondary | ICD-10-CM

## 2020-05-19 DIAGNOSIS — H547 Unspecified visual loss: Secondary | ICD-10-CM

## 2020-05-19 DIAGNOSIS — R002 Palpitations: Secondary | ICD-10-CM

## 2020-05-19 DIAGNOSIS — K861 Other chronic pancreatitis: Secondary | ICD-10-CM

## 2020-05-19 DIAGNOSIS — K319 Disease of stomach and duodenum, unspecified: Secondary | ICD-10-CM

## 2020-06-11 ENCOUNTER — Encounter: Admit: 2020-06-11 | Discharge: 2020-06-11

## 2020-06-11 NOTE — Telephone Encounter
Pre procedure review  EUS - unintentional weight loss, abdominal pain  Referring: Olen Pel  Records in notes

## 2022-01-25 ENCOUNTER — Encounter: Admit: 2022-01-25 | Discharge: 2022-01-25

## 2022-05-08 ENCOUNTER — Encounter: Admit: 2022-05-08 | Discharge: 2022-05-08

## 2023-05-17 DEATH — deceased

## 2023-05-23 ENCOUNTER — Encounter: Admit: 2023-05-23 | Discharge: 2023-05-23
